# Patient Record
Sex: Female | Born: 1975 | Race: Black or African American | Hispanic: No | Marital: Single | Smoking: Never smoker
Health system: Southern US, Community
[De-identification: ages and names within clinical notes are randomized; demographics above are authoritative.]

## PROBLEM LIST (undated history)

## (undated) HISTORY — PX: TUBAL LIGATION: SHX77

---

## 2015-07-12 ENCOUNTER — Emergency Department (HOSPITAL_COMMUNITY)
Admission: EM | Admit: 2015-07-12 | Discharge: 2015-07-12 | Disposition: A | Discharge status: Home / Self Care | Payer: Self-pay | Attending: Emergency Medicine | Admitting: Emergency Medicine

## 2015-07-12 ENCOUNTER — Encounter (HOSPITAL_COMMUNITY): Payer: Self-pay

## 2015-07-12 ENCOUNTER — Emergency Department (HOSPITAL_COMMUNITY): Payer: Self-pay

## 2015-07-12 DIAGNOSIS — G8929 Other chronic pain: Secondary | ICD-10-CM | POA: Insufficient documentation

## 2015-07-12 DIAGNOSIS — R1031 Right lower quadrant pain: Secondary | ICD-10-CM | POA: Insufficient documentation

## 2015-07-12 LAB — COMPREHENSIVE METABOLIC PANEL
ALT: 16 U/L (ref 14–54)
AST: 16 U/L (ref 15–41)
Albumin: 3.7 g/dL (ref 3.5–5.0)
Alkaline Phosphatase: 61 U/L (ref 38–126)
Anion gap: 8 (ref 5–15)
BUN: 11 mg/dL (ref 6–20)
CO2: 24 mmol/L (ref 22–32)
Calcium: 9.1 mg/dL (ref 8.9–10.3)
Chloride: 106 mmol/L (ref 101–111)
Creatinine, Ser: 0.69 mg/dL (ref 0.44–1.00)
GFR calc Af Amer: >60 mL/min (ref >60)
GFR calc non Af Amer: >60 mL/min (ref >60)
Glucose, Bld: 99 mg/dL (ref 65–99)
Potassium: 3.7 mmol/L (ref 3.5–5.1)
Sodium: 138 mmol/L (ref 135–145)
Total Bilirubin: 0.3 mg/dL (ref 0.3–1.2)
Total Protein: 7.7 g/dL (ref 6.5–8.1)

## 2015-07-12 LAB — URINALYSIS, ROUTINE W REFLEX MICROSCOPIC
Bilirubin Urine: NEGATIVE
Glucose, UA: NEGATIVE mg/dL
Ketones, ur: NEGATIVE mg/dL
Leukocytes, UA: NEGATIVE
Nitrite: NEGATIVE
Protein, ur: 30 mg/dL — AB
Specific Gravity, Urine: 1.022 (ref 1.005–1.030)
pH: 5.5 (ref 5.0–8.0)

## 2015-07-12 LAB — CBC
HCT: 38.5 % (ref 36.0–46.0)
Hemoglobin: 12 g/dL (ref 12.0–15.0)
MCH: 26.8 pg (ref 26.0–34.0)
MCHC: 31.2 g/dL (ref 30.0–36.0)
MCV: 86.1 fL (ref 78.0–100.0)
Platelets: 356 10*3/uL (ref 150–400)
RBC: 4.47 MIL/uL (ref 3.87–5.11)
RDW: 13.9 % (ref 11.5–15.5)
WBC: 8.7 10*3/uL (ref 4.0–10.5)

## 2015-07-12 LAB — HCG, QUANTITATIVE, PREGNANCY: hCG, Beta Chain, Quant, S: <1 m[IU]/mL (ref <5)

## 2015-07-12 LAB — URINE MICROSCOPIC-ADD ON

## 2015-07-12 LAB — LIPASE, BLOOD: Lipase: 13 U/L (ref 11–51)

## 2015-07-12 MED ORDER — IOPAMIDOL (ISOVUE-300) INJECTION 61%
INTRAVENOUS | Status: AC
  Administered 2015-07-12: 100 mL
  Filled 2015-07-12: qty 100

## 2015-07-12 NOTE — Discharge Instructions (Signed)
The cause of your pain was not identified today. You had a CT scan performed that demonstrated kidney stones that were not causing any symptoms or blockages. You also have a cyst in left ovary. This is a simple-appearing cyst that will need to be followed up by OB/GYN. You can take Tylenol or pain returns.  Get rechecked immediately if you have any new or worrisome symptoms.  You will need to get a family doctor and a gastroenterologist to further evaluate your abdominal pain.   Abdominal Pain, Adult Many things can cause abdominal pain. Usually, abdominal pain is not caused by a disease and will improve without treatment. It can often be observed and treated at home. Your health care provider will do a physical exam and possibly order blood tests and X-rays to help determine the seriousness of your pain. However, in many cases, more time must pass before a clear cause of the pain can be found. Before that point, your health care provider may not know if you need more testing or further treatment. HOME CARE INSTRUCTIONS Monitor your abdominal pain for any changes. The following actions may help to alleviate any discomfort you are experiencing:  Only take over-the-counter or prescription medicines as directed by your health care provider.  Do not take laxatives unless directed to do so by your health care provider.  Try a clear liquid diet (broth, tea, or water) as directed by your health care provider. Slowly move to a bland diet as tolerated. SEEK MEDICAL CARE IF:  You have unexplained abdominal pain.  You have abdominal pain associated with nausea or diarrhea.  You have pain when you urinate or have a bowel movement.  You experience abdominal pain that wakes you in the night.  You have abdominal pain that is worsened or improved by eating food.  You have abdominal pain that is worsened with eating fatty foods.  You have a fever. SEEK IMMEDIATE MEDICAL CARE IF:  Your pain does not go  away within 2 hours.  You keep throwing up (vomiting).  Your pain is felt only in portions of the abdomen, such as the right side or the left lower portion of the abdomen.  You pass bloody or black tarry stools. MAKE SURE YOU:  Understand these instructions.  Will watch your condition.  Will get help right away if you are not doing well or get worse.   This information is not intended to replace advice given to you by your health care provider. Make sure you discuss any questions you have with your health care provider.   Document Released: 10/26/2004 Document Revised: 10/07/2014 Document Reviewed: 09/25/2012 Elsevier Interactive Patient Education [employer]2016 Elsevier Inc.

## 2015-07-12 NOTE — ED Provider Notes (Signed)
CSN: [id number]     Arrival date & time 07/12/15  1028 History  By signing my name below, I, Tanda RockersMargaux Venter, attest that this documentation has been prepared under the direction and in the presence of Tilden FossaElizabeth Zayne Draheim, MD. Electronically Signed: Tanda RockersMargaux Venter, ED Scribe. 07/12/2015. 4:03 PM.   Chief Complaint  Patient presents with  . Abdominal Pain   The history is provided by the patient. No language interpreter was used.    HPI Comments: Erica Padilla is a 40 y.o. female who presents to the Emergency Department complaining of gradual onset, constant, burning, RLQ abdominal pain radiating to back x 2 months. The pain is exacerbated with prlonged sitting. Pt has never been evaluated for these symptoms in the past. No new recent sexual partners. Pt decided to come in today due to being overtly worried since her mother passed away from colon cancer. She does not have a PCP and does not have insurance to be able to afford to see someone for these symptoms. Denies fever, nausea, vomiting, diarrhea, dysuria, vaginal discharge, or any other associated symptoms.    History reviewed. No pertinent past medical history. History reviewed. No pertinent past surgical history. No family history on file. Social History  Substance Use Topics  . Smoking status: Never Smoker   . Smokeless tobacco: None  . Alcohol Use: No   OB History    No data available     Review of Systems  Constitutional: Negative for fever.  Gastrointestinal: Positive for abdominal pain. Negative for nausea, vomiting and diarrhea.  Genitourinary: Negative for dysuria and vaginal discharge.  Musculoskeletal: Positive for back pain.  All other systems reviewed and are negative.   Allergies  Review of patient's allergies indicates no known allergies.  Home Medications   Prior to Admission medications   Not on File   BP 133/68 mmHg  Pulse 84  Temp(Src) 98.1 F (36.7 C) (Oral)  Resp 16  Ht 5\' 5"  (1.651 m)  Wt 294 lb  (133.358 kg)  BMI 48.92 kg/m2  SpO2 100%  LMP 06/28/2015 Physical Exam  Constitutional: She is oriented to person, place, and time. She appears well-developed and well-nourished.  HENT:  Head: Normocephalic and atraumatic.  Cardiovascular: Normal rate and regular rhythm.   No murmur heard. Pulmonary/Chest: Effort normal and breath sounds normal. No respiratory distress.  Abdominal: Soft. There is tenderness. There is no rebound and no guarding.  Mild right lower quadrant tenderness and suprapubic tenderness  Musculoskeletal: She exhibits no edema or tenderness.  Neurological: She is alert and oriented to person, place, and time.  Skin: Skin is warm and dry.  Psychiatric: Her behavior is normal.  Pt is tearful on exam  Nursing note and vitals reviewed.   ED Course  Procedures (including critical care time)  DIAGNOSTIC STUDIES: Oxygen Saturation is 95% on RA, adequate by my interpretation.    COORDINATION OF CARE: 3:59 PM-Discussed treatment plan which includes CT A/P with pt at bedside and pt agreed to plan.   Labs Review Labs Reviewed  URINALYSIS, ROUTINE W REFLEX MICROSCOPIC (NOT AT [facility]) - Abnormal; Notable for the following:    Hgb urine dipstick TRACE (*)    Protein, ur 30 (*)    All other components within normal limits  URINE MICROSCOPIC-ADD ON - Abnormal; Notable for the following:    Squamous Epithelial / LPF 0-5 (*)    Bacteria, UA FEW (*)    All other components within normal limits  LIPASE, BLOOD  COMPREHENSIVE METABOLIC PANEL  CBC  HCG, QUANTITATIVE, PREGNANCY    Imaging Review Ct Abdomen Pelvis W Contrast  07/12/2015  CLINICAL DATA:  Right-sided abdominal pain for 3 days EXAM: CT ABDOMEN AND PELVIS WITH CONTRAST TECHNIQUE: Multidetector CT imaging of the abdomen and pelvis was performed using the standard protocol following bolus administration of intravenous contrast. CONTRAST:  85 mL ISOVUE-300 IOPAMIDOL (ISOVUE-300) INJECTION 61% COMPARISON:  None.  FINDINGS: Lung bases are free of acute infiltrate or sizable effusion. The liver, gallbladder, spleen, adrenal glands and pancreas are within normal limits. Kidneys are within normal limits bilaterally. A few tiny nonobstructing renal stones are noted bilaterally. Normal excretion of contrast material is seen. Small fat containing umbilical hernia is seen. Scattered diverticular change is noted. The appendix is within normal limits. No bowel obstructive change is seen. A left ovarian cyst is noted measuring 3.4 cm. The uterus is otherwise within normal limits. The bladder is well distended. No pelvic mass lesion is seen. No acute bony abnormality is noted. IMPRESSION: Tiny nonobstructing renal stones. Left ovarian cyst without complicating factors. Small fat containing umbilical hernia. Diverticulosis without diverticulitis. Electronically Signed   By: Alcide Clever M.D.   On: 07/12/2015 17:35   I have personally reviewed and evaluated these images and lab results as part of my medical decision-making.   EKG Interpretation None      MDM   Final diagnoses:  Chronic RLQ pain   Patient here for evaluation of 2 months of right lower quadrant pain. She does have some tenderness on examination. She is concern for possible colon cancer since her mother has had. CT scan obtained with no significant abnormalities. Discussed the patient findings of nonobstructive kidney stones and ovarian cyst. Discussed outpatient follow-up, home care, return precautions.  I personally performed the services described in this documentation, which was scribed in my presence. The recorded information has been reviewed and is accurate.      Tilden Fossa, MD 07/13/15 [phone]

## 2015-07-12 NOTE — ED Notes (Addendum)
Patient complaining of right lower quadrant  burning pain that begins in the front and radiates to the back  that has been going on for a month. LMP was 2 weeks ago an dno vaginal discharge. Came in today because she increasingly worried.

## 2016-02-05 ENCOUNTER — Encounter (HOSPITAL_COMMUNITY): Payer: Self-pay | Admitting: Emergency Medicine

## 2016-02-05 ENCOUNTER — Emergency Department (HOSPITAL_COMMUNITY)
Admission: EM | Admit: 2016-02-05 | Discharge: 2016-02-05 | Disposition: A | Discharge status: Home / Self Care | Payer: Self-pay | Attending: Emergency Medicine | Admitting: Emergency Medicine

## 2016-02-05 ENCOUNTER — Emergency Department (HOSPITAL_COMMUNITY): Payer: Self-pay

## 2016-02-05 DIAGNOSIS — M545 Low back pain, unspecified: Secondary | ICD-10-CM

## 2016-02-05 LAB — URINALYSIS, MICROSCOPIC (REFLEX)

## 2016-02-05 LAB — URINALYSIS, ROUTINE W REFLEX MICROSCOPIC
Bilirubin Urine: NEGATIVE
GLUCOSE, UA: NEGATIVE mg/dL
Ketones, ur: NEGATIVE mg/dL
Nitrite: NEGATIVE
PH: 6.5 (ref 5.0–8.0)
PROTEIN: 30 mg/dL — AB
SPECIFIC GRAVITY, URINE: 1.02 (ref 1.005–1.030)

## 2016-02-05 LAB — POC URINE PREG, ED: Preg Test, Ur: NEGATIVE

## 2016-02-05 MED ORDER — METHOCARBAMOL 500 MG PO TABS: 500.0000 mg | ORAL_TABLET | Freq: Every evening | ORAL | 0 refills | Status: DC | PRN

## 2016-02-05 MED ORDER — METHOCARBAMOL 500 MG PO TABS
1000.0000 mg | ORAL_TABLET | Freq: Once | ORAL | Status: AC
  Administered 2016-02-05: 1000 mg via ORAL
  Filled 2016-02-05: qty 2

## 2016-02-05 MED ORDER — TRAMADOL HCL 50 MG PO TABS: 50.0000 mg | ORAL_TABLET | Freq: Four times a day (QID) | ORAL | 0 refills | Status: DC | PRN

## 2016-02-05 MED ORDER — MELOXICAM 15 MG PO TABS: 15.0000 mg | ORAL_TABLET | Freq: Every day | ORAL | 0 refills | Status: DC

## 2016-02-05 MED ORDER — TRAMADOL HCL 50 MG PO TABS
100.0000 mg | ORAL_TABLET | Freq: Once | ORAL | Status: AC
  Administered 2016-02-05: 100 mg via ORAL
  Filled 2016-02-05: qty 2

## 2016-02-05 MED ORDER — MELOXICAM 15 MG PO TABS
15.0000 mg | ORAL_TABLET | Freq: Every day | ORAL | Status: DC
  Administered 2016-02-05: 15 mg via ORAL
  Filled 2016-02-05: qty 1

## 2016-02-05 NOTE — ED Provider Notes (Signed)
[facility]-EMERGENCY DEPT Provider Note   CSN: [id number] Arrival date & time: 02/05/16  1529  By signing my name below, I, Vista Minkobert Ross, attest that this documentation has been prepared under the direction and in the presence of YahooKelly Seabron Iannello PA-C.  Electronically Signed: Vista Minkobert Ross, ED Scribe. 02/05/16. 4:59 PM.   History   Chief Complaint Chief Complaint  Patient presents with  . Back Pain   HPI HPI Comments: Erica Padilla is a 41 y.o. female who presents to the Emergency Department complaining of persistent lower back pain and stiffness that started approximately one month ago. Pt states that her pain is worse in the morning when she wakes up and states that it is extremely stiff. The stiffness and pain is relieved progressively throughout the morning as she moves around more. She presents today because she had increased difficulty getting out of bed this morning. She does report an intermittent pain that radiates circumferentially on both sides of her hips. Pt has taken Aleve and Flexeril with no significant relief of symptoms. No numbness, bowel or bladder incontinence. No radiation of pain in lower extremities. No urinary symptoms. She denies any known injury. Pt has no current PCP and is uninsured. 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  The history is provided by the patient. No language interpreter was used.    History reviewed. No pertinent past medical history.  There are no active problems to display for this patient.   History reviewed. No pertinent surgical history.  OB History    No data available      Home Medications    Prior to Admission medications   Not on File    Family History No family history on file.  Social History Social History  Substance Use Topics  . Smoking status: Never Smoker  . Smokeless tobacco: Not on file  . Alcohol use No    Allergies   Patient has no known allergies.   Review of Systems Review of Systems    Endocrine: Negative for polyuria.  Genitourinary: Negative for dysuria, hematuria and urgency.  Musculoskeletal: Positive for back pain (lower).  Neurological: Negative for numbness.     Physical Exam Updated Vital Signs BP 138/82 (BP Location: Left Arm)   Pulse 107   Temp 97.9 F (36.6 C) (Oral)   Resp 18   Ht 5\' 4"  (1.626 m)   Wt 232 lb (105.2 kg)   LMP 01/15/2016   SpO2 100%   BMI 39.82 kg/m   Physical Exam  Constitutional: She is oriented to person, place, and time. She appears well-developed and well-nourished. No distress.  HENT:  Head: Normocephalic and atraumatic.  Eyes: Conjunctivae are normal. Pupils are equal, round, and reactive to light. Right eye exhibits no discharge. Left eye exhibits no discharge. No scleral icterus.  Neck: Normal range of motion.  Cardiovascular: Normal rate.   Pulmonary/Chest: Effort normal. No respiratory distress.  Abdominal: She exhibits no distension.  Musculoskeletal:  Back: Inspection: No masses, deformity, or rash Palpation: Lumbar midline spinal tenderness with bilateral flank tenderness. Strength: 5/5 in lower extremities and normal plantar and dorsiflexion Sensation: Intact sensation with light touch in lower extremities bilaterally Gait: Stiff gait Reflexes: Patellar reflex is 2+ bilaterally SLR: Negative seated straight leg raise   Neurological: She is alert and oriented to person, place, and time.  Skin: Skin is warm and dry.  Psychiatric: She has a normal mood and affect. Her behavior is normal.  Nursing note and vitals  reviewed.    ED Treatments / Results  DIAGNOSTIC STUDIES: Oxygen Saturation is 100% on RA, normal by my interpretation.  COORDINATION OF CARE: 4:55 PM-Discussed treatment plan with pt at bedside and pt agreed to plan.   Labs (all labs ordered are listed, but only abnormal results are displayed) Labs Reviewed  URINALYSIS, ROUTINE W REFLEX MICROSCOPIC - Abnormal; Notable for the following:        Result Value   Hgb urine dipstick SMALL (*)    Protein, ur 30 (*)    Leukocytes, UA SMALL (*)    All other components within normal limits  URINALYSIS, MICROSCOPIC (REFLEX) - Abnormal; Notable for the following:    Bacteria, UA MANY (*)    Squamous Epithelial / LPF 6-30 (*)    All other components within normal limits  POC URINE PREG, ED    EKG  EKG Interpretation None       Radiology Dg Lumbar Spine Complete  Result Date: 02/05/2016 CLINICAL DATA:  Low back pain, no injury EXAM: LUMBAR SPINE - COMPLETE 4+ VIEW COMPARISON:  None. FINDINGS: Normal alignment. No fracture or mass. S1 is partially lumbarized. Lowest vertebra L5 superior endplate. No significant degenerative change. No pars defect. IMPRESSION: Negative. Electronically Signed   By: Marlan Palau M.D.   On: 02/05/2016 18:29    Procedures Procedures (including critical care time)  Medications Ordered in ED Medications  methocarbamol (ROBAXIN) tablet 1,000 mg (1,000 mg Oral Given 02/05/16 1806)  traMADol (ULTRAM) tablet 100 mg (100 mg Oral Given 02/05/16 1806)     Initial Impression / Assessment and Plan / ED Course  I have reviewed the triage vital signs and the nursing notes.  Pertinent labs & imaging results that were available during my care of the patient were reviewed by me and considered in my medical decision making (see chart for details).  Clinical Course    41 year old female with MSK back pain for a month now. Unclear etiology. Possibly started as lumbar strain and has persisted due to her profession. Back exam is benign. Xray negative. UA shows small hgb, 30 protein, small leukocytes, many bacteria, and 6-30 WBC however appears contaminated and pt has no urinary complaints. Will hold off on treatment at this time. Pain treated in ED and pt reports some improvement. Will refer to Ortho and rx NSAIDs, muscle relaxer, and tramadol prn. Patient is NAD, non-toxic, with stable VS. Patient is informed of clinical  course, understands medical decision making process, and agrees with plan. Opportunity for questions provided and all questions answered. Return precautions given.   Final Clinical Impressions(s) / ED Diagnoses   Final diagnoses:  Bilateral low back pain without sciatica, unspecified chronicity    New Prescriptions Discharge Medication List as of 02/05/2016  7:25 PM    START taking these medications   Details  meloxicam (MOBIC) 15 MG tablet Take 1 tablet (15 mg total) by mouth daily., Starting Sat 02/05/2016, Print    methocarbamol (ROBAXIN) 500 MG tablet Take 1 tablet (500 mg total) by mouth at bedtime and may repeat dose one time if needed., Starting Sat 02/05/2016, Print    traMADol (ULTRAM) 50 MG tablet Take 1 tablet (50 mg total) by mouth every 6 (six) hours as needed., Starting Sat 02/05/2016, Print       I personally performed the services described in this documentation, which was scribed in my presence. The recorded information has been reviewed and is accurate.     Bethel Born, PA-C  02/06/16 1112    Vanetta Mulders, MD 02/08/16 2122

## 2016-02-05 NOTE — Discharge Instructions (Signed)
Take anti-inflammatory medicine (Mobic) for the next week. Take this medicine with food. Take muscle relaxer at bedtime to help you sleep. This medicine makes you drowsy so do not take before driving or work Take pain medicine when pain is severe Use a heating pad for sore muscles - use for 20 minutes several times a day Follow up with Orthopedics

## 2016-02-05 NOTE — ED Triage Notes (Signed)
Pt complaint of lower back pain/spasms for a month. Pt denies injury or GU symptoms.

## 2016-09-19 ENCOUNTER — Emergency Department (HOSPITAL_COMMUNITY)
Admission: EM | Admit: 2016-09-19 | Discharge: 2016-09-19 | Disposition: A | Discharge status: Home / Self Care | Payer: Self-pay | Attending: Emergency Medicine | Admitting: Emergency Medicine

## 2016-09-19 ENCOUNTER — Encounter (HOSPITAL_COMMUNITY): Payer: Self-pay | Admitting: Emergency Medicine

## 2016-09-19 ENCOUNTER — Emergency Department (HOSPITAL_COMMUNITY): Payer: Self-pay

## 2016-09-19 DIAGNOSIS — Y92000 Kitchen of unspecified non-institutional (private) residence as  the place of occurrence of the external cause: Secondary | ICD-10-CM | POA: Insufficient documentation

## 2016-09-19 DIAGNOSIS — Y939 Activity, unspecified: Secondary | ICD-10-CM | POA: Insufficient documentation

## 2016-09-19 DIAGNOSIS — Y998 Other external cause status: Secondary | ICD-10-CM | POA: Insufficient documentation

## 2016-09-19 DIAGNOSIS — W208XXA Other cause of strike by thrown, projected or falling object, initial encounter: Secondary | ICD-10-CM | POA: Insufficient documentation

## 2016-09-19 DIAGNOSIS — S92424A Nondisplaced fracture of distal phalanx of right great toe, initial encounter for closed fracture: Secondary | ICD-10-CM | POA: Insufficient documentation

## 2016-09-19 MED ORDER — IBUPROFEN 600 MG PO TABS: 600.0000 mg | ORAL_TABLET | Freq: Four times a day (QID) | ORAL | 0 refills | Status: DC | PRN

## 2016-09-19 MED ORDER — HYDROCODONE-ACETAMINOPHEN 5-325 MG PO TABS: 1.0000 | ORAL_TABLET | ORAL | 0 refills | Status: DC | PRN

## 2016-09-19 NOTE — ED Notes (Signed)
ED Provider at bedside. 

## 2016-09-19 NOTE — ED Triage Notes (Signed)
Pt reports glass from oven door falling out and landing on R foot on Sunday. Pt c/o pain and swelling to R big toe.  Pt ambulatory.

## 2016-09-19 NOTE — ED Provider Notes (Signed)
[facility]-EMERGENCY DEPT Provider Note   CSN: [id number] Arrival date & time: 09/19/16  4098     History   Chief Complaint Chief Complaint  Patient presents with  . Toe Injury    HPI Erica Padilla is a 41 y.o. female.  Pt presents to the ED today with right big toe pain.  Pt said she shut the oven and the glass fell out on her right big toe 2 days ago.  The pt said that it is painful and swollen.  She can walk, but it hurts.      History reviewed. No pertinent past medical history.  There are no active problems to display for this patient.   Past Surgical History:  Procedure Laterality Date  . TUBAL LIGATION      OB History    No data available       Home Medications    Prior to Admission medications   Medication Sig Start Date End Date Taking? Authorizing Provider  HYDROcodone-acetaminophen (NORCO/VICODIN) 5-325 MG tablet Take 1 tablet by mouth every 4 (four) hours as needed. 09/19/16   Jacalyn Lefevre, MD  ibuprofen (ADVIL,MOTRIN) 600 MG tablet Take 1 tablet (600 mg total) by mouth every 6 (six) hours as needed. 09/19/16   Jacalyn Lefevre, MD  meloxicam (MOBIC) 15 MG tablet Take 1 tablet (15 mg total) by mouth daily. 02/05/16   Bethel Born, PA-C  methocarbamol (ROBAXIN) 500 MG tablet Take 1 tablet (500 mg total) by mouth at bedtime and may repeat dose one time if needed. 02/05/16   Bethel Born, PA-C  traMADol (ULTRAM) 50 MG tablet Take 1 tablet (50 mg total) by mouth every 6 (six) hours as needed. 02/05/16   Bethel Born, PA-C    Family History No family history on file.  Social History Social History  Substance Use Topics  . Smoking status: Never Smoker  . Smokeless tobacco: Never Used  . Alcohol use No     Allergies   Patient has no known allergies.   Review of Systems Review of Systems  Musculoskeletal:       Right great toe pain  All other systems reviewed and are negative.    Physical Exam Updated Vital Signs BP (!) 141/89 (BP  Location: Right Arm)   Pulse 79   Temp 98.2 F (36.8 C) (Oral)   Resp 18   Ht 5\' 4"  (1.626 m)   Wt 99.3 kg (219 lb)   LMP 08/26/2016   SpO2 100%   BMI 37.59 kg/m   Physical Exam  Constitutional: She appears well-developed and well-nourished.  HENT:  Head: Normocephalic and atraumatic.  Right Ear: External ear normal.  Left Ear: External ear normal.  Nose: Nose normal.  Mouth/Throat: Oropharynx is clear and moist.  Eyes: Pupils are equal, round, and reactive to light. Conjunctivae and EOM are normal.  Neck: Normal range of motion. Neck supple.  Cardiovascular: Normal rate, regular rhythm, normal heart sounds and intact distal pulses.   Pulmonary/Chest: Effort normal and breath sounds normal.  Abdominal: Soft. Bowel sounds are normal.  Musculoskeletal:       Feet:  Nursing note and vitals reviewed.    ED Treatments / Results  Labs (all labs ordered are listed, but only abnormal results are displayed) Labs Reviewed - No data to display  EKG  EKG Interpretation None       Radiology Dg Toe Great Right  Result Date: 09/19/2016 CLINICAL DATA:  Injury.  Pain. EXAM: RIGHT GREAT TOE COMPARISON:  No recent prior . FINDINGS: No radiopaque foreign body. Tiny fracture noted along the medial base of the proximal phalanx of the right great toe. Slight displacement. Diffuse degenerative change. IMPRESSION: Tiny slightly displaced fracture fragment noted along the medial base of the proximal phalanx of the right great toe. Electronically Signed   By: Maisie Fus  Register   On: 09/19/2016 07:40    Procedures Procedures (including critical care time)  Medications Ordered in ED Medications - No data to display   Initial Impression / Assessment and Plan / ED Course  I have reviewed the triage vital signs and the nursing notes.  Pertinent labs & imaging results that were available during my care of the patient were reviewed by me and considered in my medical decision making (see  chart for details).    Pt will be placed in post op shoe.  She is given the number of ortho to f/u.  Return if worse.  Final Clinical Impressions(s) / ED Diagnoses   Final diagnoses:  Closed nondisplaced fracture of distal phalanx of right great toe, initial encounter    New Prescriptions New Prescriptions   HYDROCODONE-ACETAMINOPHEN (NORCO/VICODIN) 5-325 MG TABLET    Take 1 tablet by mouth every 4 (four) hours as needed.   IBUPROFEN (ADVIL,MOTRIN) 600 MG TABLET    Take 1 tablet (600 mg total) by mouth every 6 (six) hours as needed.     Jacalyn Lefevre, MD 09/19/16 [id number]

## 2016-12-17 ENCOUNTER — Encounter (HOSPITAL_COMMUNITY): Payer: Self-pay

## 2016-12-17 ENCOUNTER — Emergency Department (HOSPITAL_COMMUNITY)
Admission: EM | Admit: 2016-12-17 | Discharge: 2016-12-17 | Disposition: A | Discharge status: Home / Self Care | Payer: Self-pay | Attending: Emergency Medicine | Admitting: Emergency Medicine

## 2016-12-17 DIAGNOSIS — Y939 Activity, unspecified: Secondary | ICD-10-CM | POA: Insufficient documentation

## 2016-12-17 DIAGNOSIS — S39012A Strain of muscle, fascia and tendon of lower back, initial encounter: Secondary | ICD-10-CM | POA: Insufficient documentation

## 2016-12-17 DIAGNOSIS — Y929 Unspecified place or not applicable: Secondary | ICD-10-CM | POA: Insufficient documentation

## 2016-12-17 DIAGNOSIS — Y33XXXA Other specified events, undetermined intent, initial encounter: Secondary | ICD-10-CM | POA: Insufficient documentation

## 2016-12-17 DIAGNOSIS — Y998 Other external cause status: Secondary | ICD-10-CM | POA: Insufficient documentation

## 2016-12-17 MED ORDER — CYCLOBENZAPRINE HCL 10 MG PO TABS: 10.0000 mg | ORAL_TABLET | Freq: Two times a day (BID) | ORAL | 0 refills | Status: AC | PRN

## 2016-12-17 MED ORDER — NAPROXEN 500 MG PO TABS: 500.0000 mg | ORAL_TABLET | Freq: Two times a day (BID) | ORAL | 0 refills | Status: AC

## 2016-12-17 NOTE — ED Triage Notes (Signed)
Per Pt, Pt is coming from home with complaints of lower back pain that she has been having intermittently x 1 year. Pt has been seen in the past and stated that it was a pulled muscle. Denies urinary symptoms at this time.

## 2016-12-17 NOTE — Discharge Instructions (Signed)
Call [facility] to schedule follow [facility].

## 2016-12-17 NOTE — ED Provider Notes (Signed)
[facility]ENC[facility] Provider Note   CSN: [id number] Arrival date & time: 12/17/16  1408     History   Chief Complaint Chief Complaint  Patient presents with  . Back Pain    HPI Erica Padilla is a 41 y.o. female who presents to the ED with back pain. Patient that she has had back pain off and on for the past year. Patient has been evaluated in the past for same and at that time had a pulled muscle. 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   HPI  History reviewed. No pertinent past medical history.  There are no active problems to display for this patient.   Past Surgical History:  Procedure Laterality Date  . TUBAL LIGATION      OB History    No data available       Home Medications    Prior to Admission medications   Medication Sig Start Date End Date Taking? Authorizing Provider  cyclobenzaprine (FLEXERIL) 10 MG tablet Take 1 tablet (10 mg total) 2 (two) times daily as needed by mouth for muscle spasms. 12/17/16   Janne NapoleonNeese, Trenna Kiely M, NP  naproxen (NAPROSYN) 500 MG tablet Take 1 tablet (500 mg total) 2 (two) times daily by mouth. 12/17/16   Janne NapoleonNeese, Dura Mccormack M, NP    Family History No family history on file.  Social History Social History   Tobacco Use  . Smoking status: Never Smoker  . Smokeless tobacco: Never Used  Substance Use Topics  . Alcohol use: No  . Drug use: No     Allergies   Patient has no known allergies.   Review of Systems Review of Systems  Constitutional: Negative for fever.  HENT: Negative.   Eyes: Negative for pain, discharge and visual disturbance.  Respiratory: Negative for cough and shortness of breath.   Cardiovascular: Negative for chest pain.  Gastrointestinal: Negative for abdominal pain, nausea and vomiting.  Genitourinary: Negative for dysuria, frequency and urgency.    Musculoskeletal: Positive for back pain. Negative for neck pain.  Skin: Negative for rash and wound.  Neurological: Negative for dizziness, syncope and headaches.  Psychiatric/Behavioral: Negative for confusion. The patient is not nervous/anxious.      Physical Exam Updated Vital Signs BP (!) 143/86 (BP Location: Right Arm)   Pulse 91   Temp 98.3 F (36.8 C) (Oral)   Resp 18   Ht 5\' 4"  (1.626 m)   Wt 102.5 kg (226 lb)   LMP 11/10/2016   SpO2 100%   BMI 38.79 kg/m   Physical Exam  Constitutional: She appears well-developed and well-nourished. No distress.  HENT:  Head: Normocephalic and atraumatic.  Nose: Nose normal.  Mouth/Throat: Uvula is midline, oropharynx is clear and moist and mucous membranes are normal.  Eyes: EOM are normal.  Neck: Normal range of motion. Neck supple.  Cardiovascular: Normal rate and regular rhythm.  Pulmonary/Chest: Effort normal. She has no wheezes. She has no rales.  Abdominal: Soft. Bowel sounds are normal. There is no tenderness.  Musculoskeletal:       Lumbar back: She exhibits tenderness, pain and spasm. She exhibits normal pulse.       Back:  Neurological: She is alert. She has normal strength. No sensory deficit. Gait normal.  Reflex Scores:      Bicep reflexes are 2+ on the right  side and 2+ on the left side.      Brachioradialis reflexes are 2+ on the right side and 2+ on the left side.      Patellar reflexes are 2+ on the right side and 2+ on the left side. Straight leg raises without difficulty  Skin: Skin is warm and dry.  Psychiatric: She has a normal mood and affect. Her behavior is normal.  Nursing note and vitals reviewed.    ED Treatments / Results  Labs (all labs ordered are listed, but only abnormal results are displayed) Labs Reviewed - No data to display   Radiology No results found.  Procedures Procedures (including critical care time)  Medications Ordered in ED Medications - No data to  display   Initial Impression / Assessment and Plan / ED Course  I have reviewed the triage vital signs and the nursing notes.  Patient with back pain.  No neurological deficits and normal neuro exam.  Patient can walk but states is painful.  No loss of bowel or bladder control.  No concern for cauda equina.  No fever, night sweats, weight loss, h/o cancer, IVDU.  RICE protocol and pain medicine indicated and discussed with patient. Discussed that medication can cause her to be sleepy.   Final Clinical Impressions(s) / ED Diagnoses   Final diagnoses:  Strain of lumbar region, initial encounter    ED Discharge Orders        Ordered    naproxen (NAPROSYN) 500 MG tablet  2 times daily     12/17/16 1730    cyclobenzaprine (FLEXERIL) 10 MG tablet  2 times daily PRN     12/17/16 1730       Kerrie Buffaloeese, Tayten Bergdoll McCallsburgM, NP 12/17/16 1737    Tilden Fossaees, Elizabeth, MD 12/19/16 [phone]

## 2017-04-12 ENCOUNTER — Other Ambulatory Visit (HOSPITAL_COMMUNITY)
Admission: RE | Admit: 2017-04-12 | Discharge: 2017-04-12 | Disposition: A | Discharge status: Home / Self Care | Payer: BLUE CROSS/BLUE SHIELD | Source: Ambulatory Visit | Attending: Physician Assistant | Admitting: Physician Assistant

## 2017-04-12 ENCOUNTER — Other Ambulatory Visit: Payer: Self-pay | Admitting: Physician Assistant

## 2017-04-12 DIAGNOSIS — Z0000 Encounter for general adult medical examination without abnormal findings: Secondary | ICD-10-CM | POA: Diagnosis not present

## 2017-04-18 LAB — CYTOLOGY - PAP
DIAGNOSIS: NEGATIVE
HPV: NOT DETECTED

## 2017-11-05 IMAGING — CR DG LUMBAR SPINE COMPLETE 4+V
5 series · 5 of 5 positions shown · non-contrast
Comparison: None.

CLINICAL DATA: Low back pain, no injury

EXAM:
LUMBAR SPINE - COMPLETE 4+ VIEW

[t lumbar spine ap]
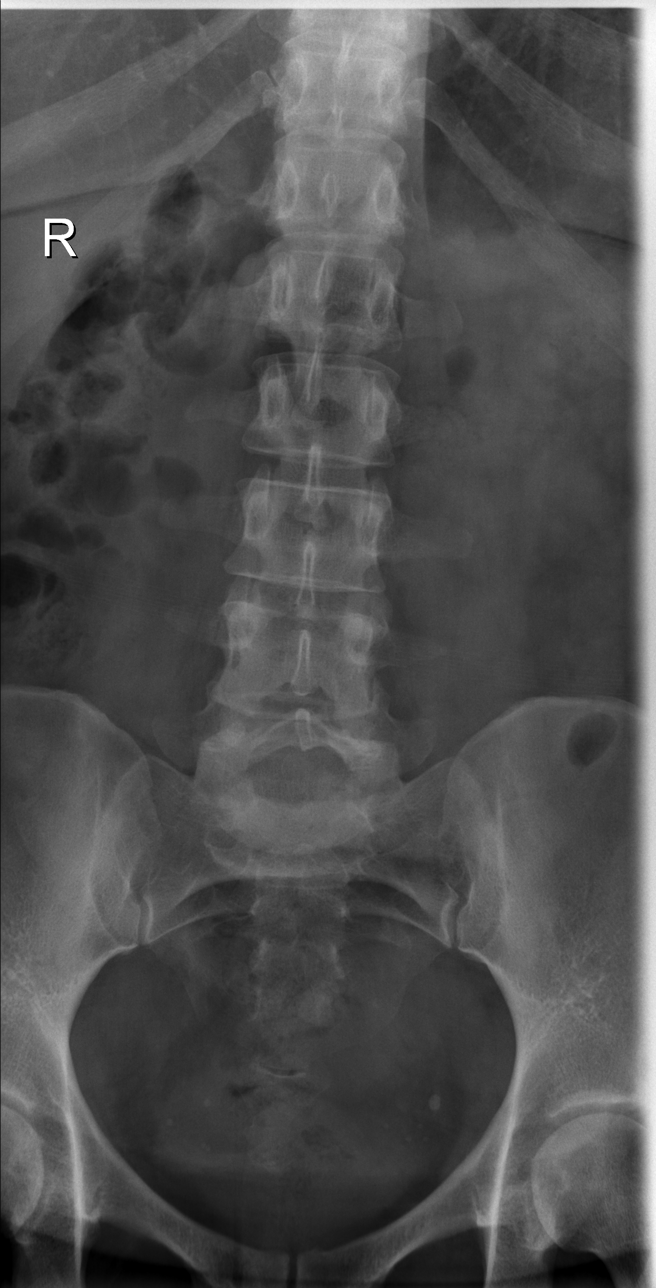

[t lumbar spine obl (1 of 2)]
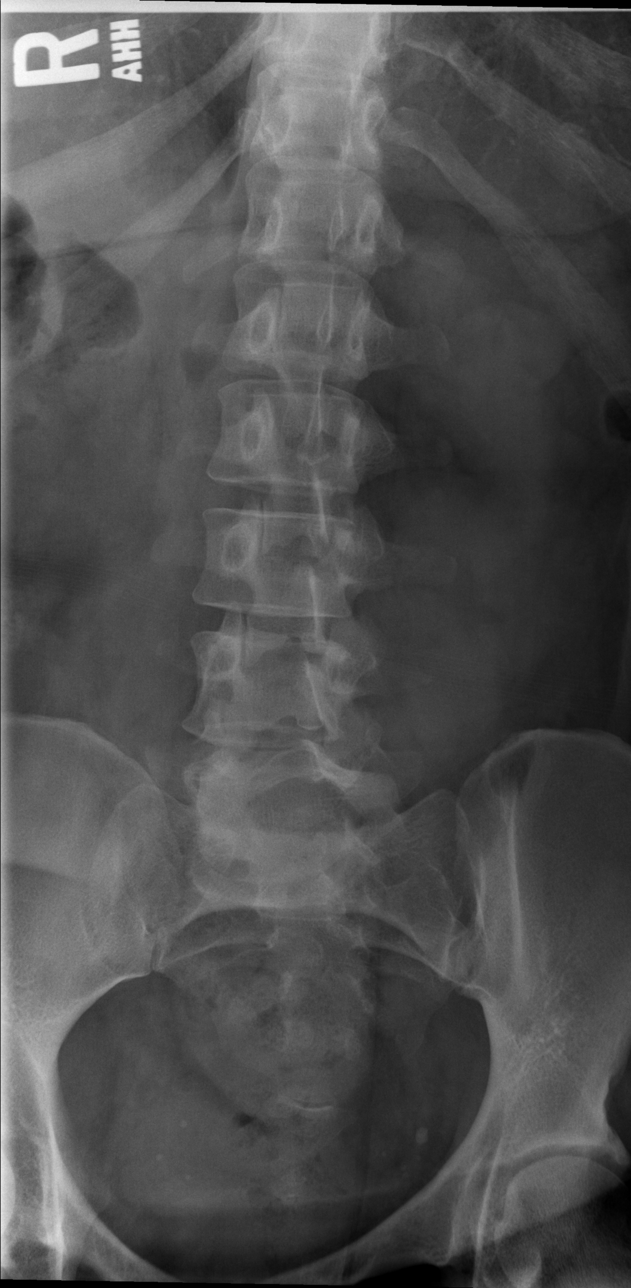

[t lumbar spine obl (2 of 2)]
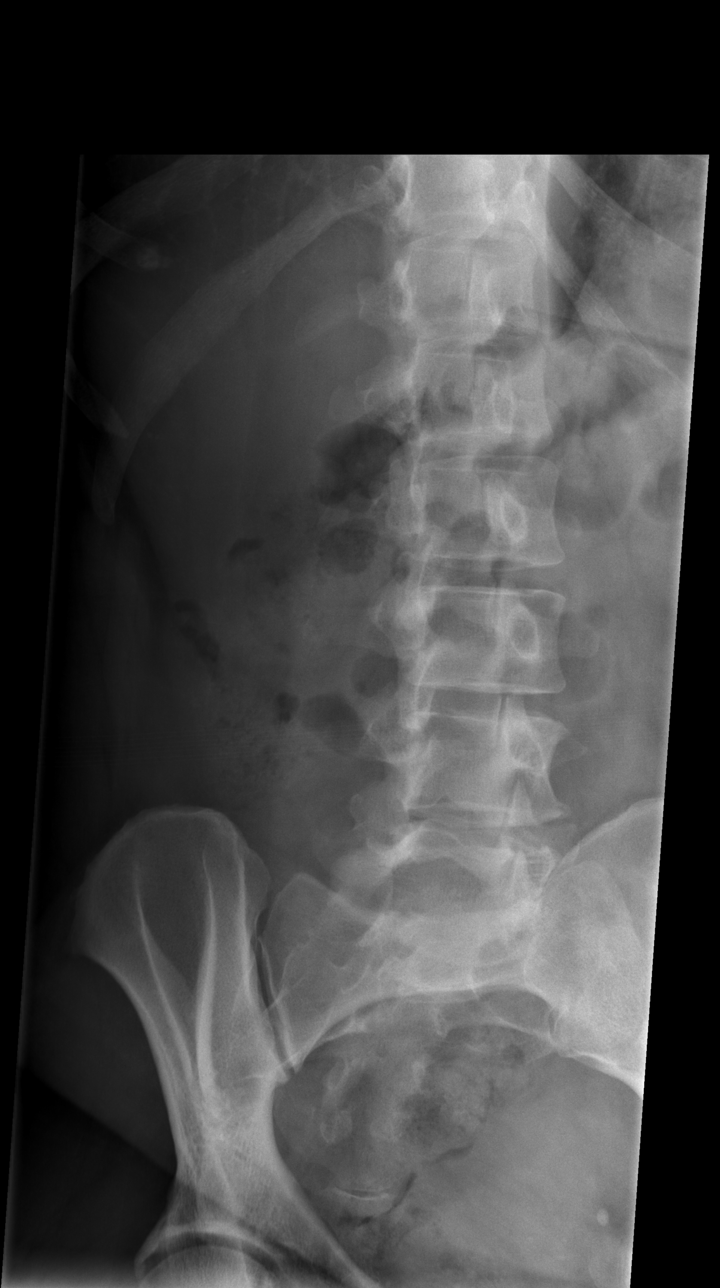

[t lumbar spine lat]
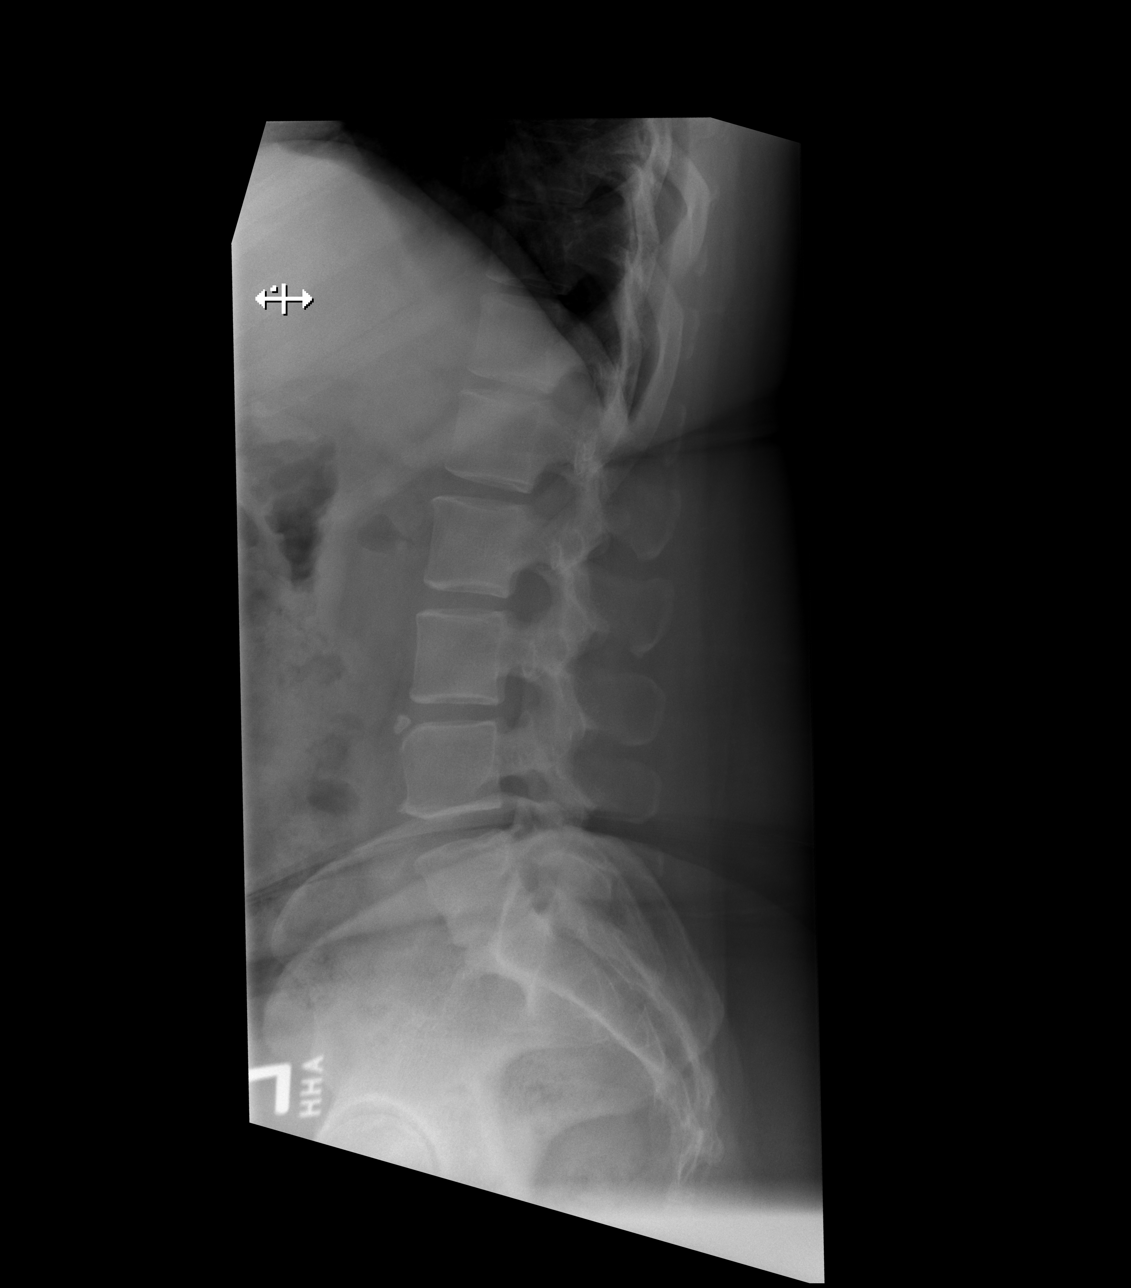

[t lumbar l-5 s-1 spot]
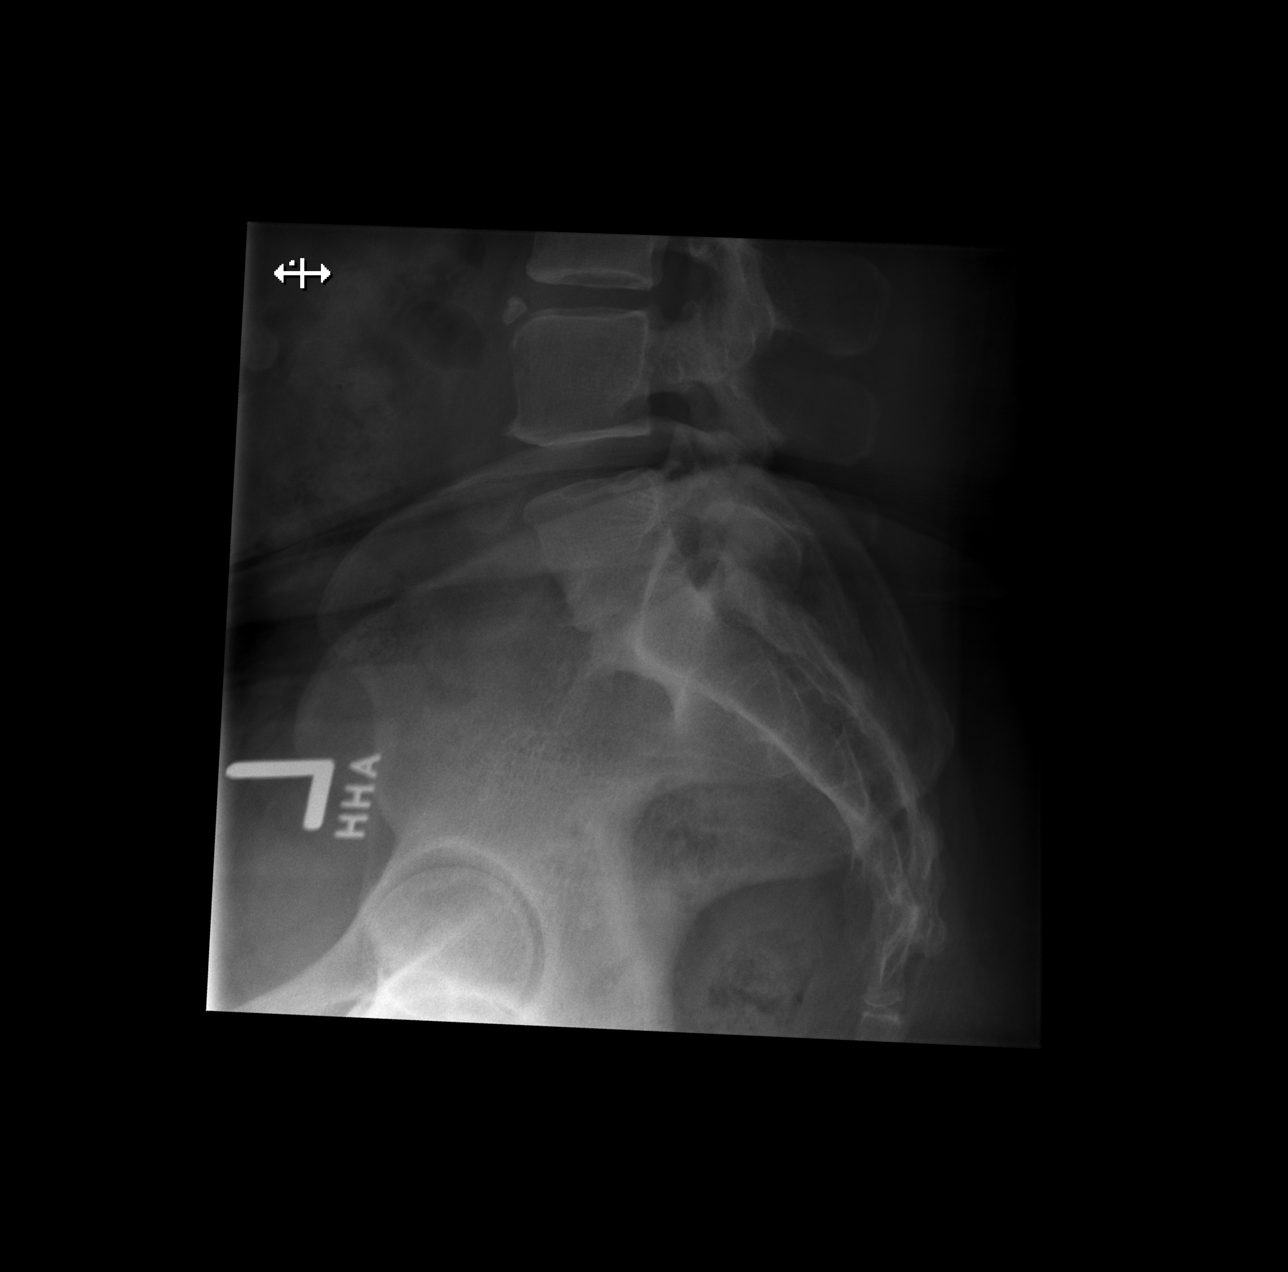

[5 of 5 positions shown; findings below may reference images not displayed]

FINDINGS: Normal alignment. No fracture or mass. S1 is partially lumbarized.
Lowest vertebra L5 superior endplate. No significant degenerative
change. No pars defect.
IMPRESSION: Negative.

## 2018-11-21 ENCOUNTER — Other Ambulatory Visit: Payer: Self-pay

## 2018-11-21 DIAGNOSIS — Z20822 Contact with and (suspected) exposure to covid-19: Secondary | ICD-10-CM

## 2018-11-23 LAB — NOVEL CORONAVIRUS, NAA: SARS-CoV-2, NAA: NOT DETECTED

## 2020-06-10 ENCOUNTER — Ambulatory Visit
Admission: EM | Admit: 2020-06-10 | Discharge: 2020-06-10 | Disposition: A | Discharge status: Home / Self Care | Payer: 59 | Attending: Family Medicine | Admitting: Family Medicine

## 2020-06-10 ENCOUNTER — Encounter: Payer: Self-pay | Admitting: Emergency Medicine

## 2020-06-10 ENCOUNTER — Other Ambulatory Visit: Payer: Self-pay

## 2020-06-10 DIAGNOSIS — Z202 Contact with and (suspected) exposure to infections with a predominantly sexual mode of transmission: Secondary | ICD-10-CM | POA: Diagnosis not present

## 2020-06-10 MED ORDER — METRONIDAZOLE 500 MG PO TABS: ORAL_TABLET | ORAL | 0 refills | Status: DC

## 2020-06-10 NOTE — Discharge Instructions (Signed)
We have sent testing for sexually transmitted infections. We will notify you of any positive results once they are received. If required, we will prescribe any medications you might need.  Please refrain from all sexual activity for at least the next seven days.  

## 2020-06-10 NOTE — ED Provider Notes (Signed)
  [facility] CARE CENTER   [id number] 06/10/20 Arrival Time: 1810  ASSESSMENT & PLAN:  1. STD exposure    Meds ordered this encounter  Medications  . metroNIDAZOLE (FLAGYL) 500 MG tablet    Sig: Take 4 tablets as a single dose.    Dispense:  4 tablet    Refill:  0   Discharge Instructions     We have sent testing for sexually transmitted infections. We will notify you of any positive results once they are received. If required, we will prescribe any medications you might need.  Please refrain from all sexual activity for at least the next seven days.  Without s/s of PID.  Labs Reviewed  CERVICOVAGINAL ANCILLARY ONLY   Will notify of any positive results. Instructed to refrain from sexual activity for at least seven days.  Reviewed expectations re: course of current medical issues. Questions answered. Outlined signs and symptoms indicating need for more acute intervention. Patient verbalized understanding. After Visit Summary given.   SUBJECTIVE:  Erica Padilla is a 45 y.o. female who reports possible trichomonas exposure. No symptoms.   OBJECTIVE:  Vitals:   06/10/20 1850  BP: (!) 157/96  Pulse: 86  Resp: 18  Temp: 98.1 F (36.7 C)  TempSrc: Oral  SpO2: 99%     General appearance: alert, cooperative, appears stated age and no distress Lungs: unlabored respirations; speaks full sentences without difficulty Abdomen: benign GU: deferred Skin: warm and dry Psychological: alert and cooperative; normal mood and affect.    Labs Reviewed  CERVICOVAGINAL ANCILLARY ONLY    No Known Allergies  History reviewed. No pertinent past medical history. History reviewed. No pertinent family history. Social History   Socioeconomic History  . Marital status: Single    Spouse name: Not on file  . Number of children: Not on file  . Years of education: Not on file  . Highest education level: Not on file  Occupational History  . Not on file  Tobacco Use  . Smoking  status: Never Smoker  . Smokeless tobacco: Never Used  Vaping Use  . Vaping Use: Never used  Substance and Sexual Activity  . Alcohol use: No  . Drug use: No  . Sexual activity: Not on file  Other Topics Concern  . Not on file  Social History Narrative  . Not on file   Social Determinants of Health   Financial Resource Strain: Not on file  Food Insecurity: Not on file  Transportation Needs: Not on file  Physical Activity: Not on file  Stress: Not on file  Social Connections: Not on file  Intimate Partner Violence: Not on file          Mardella Layman, MD 06/10/20 1904

## 2020-06-10 NOTE — ED Triage Notes (Signed)
Pt sts possible exposure to trichomonas; pt denies sx

## 2020-06-14 ENCOUNTER — Telehealth (HOSPITAL_COMMUNITY): Payer: Self-pay | Admitting: Emergency Medicine

## 2020-06-14 LAB — CERVICOVAGINAL ANCILLARY ONLY
Bacterial Vaginitis (gardnerella): POSITIVE — AB
Candida Glabrata: NEGATIVE
Candida Vaginitis: POSITIVE — AB
Chlamydia: NEGATIVE
Comment: NEGATIVE
Comment: NEGATIVE
Comment: NEGATIVE
Comment: NEGATIVE
Comment: NEGATIVE
Comment: NORMAL
Neisseria Gonorrhea: NEGATIVE
Trichomonas: NEGATIVE

## 2020-06-14 MED ORDER — FLUCONAZOLE 150 MG PO TABS: 150.0000 mg | ORAL_TABLET | Freq: Once | ORAL | 0 refills | Status: AC

## 2023-08-14 ENCOUNTER — Emergency Department (HOSPITAL_COMMUNITY)

## 2023-08-14 ENCOUNTER — Other Ambulatory Visit: Payer: Self-pay

## 2023-08-14 ENCOUNTER — Emergency Department (HOSPITAL_COMMUNITY)
Admission: EM | Admit: 2023-08-14 | Discharge: 2023-08-15 | Disposition: A | Discharge status: Home / Self Care | Attending: Emergency Medicine | Admitting: Emergency Medicine

## 2023-08-14 ENCOUNTER — Encounter (HOSPITAL_COMMUNITY): Payer: Self-pay

## 2023-08-14 DIAGNOSIS — N760 Acute vaginitis: Secondary | ICD-10-CM | POA: Diagnosis not present

## 2023-08-14 DIAGNOSIS — I10 Essential (primary) hypertension: Secondary | ICD-10-CM | POA: Diagnosis not present

## 2023-08-14 DIAGNOSIS — N390 Urinary tract infection, site not specified: Secondary | ICD-10-CM | POA: Insufficient documentation

## 2023-08-14 DIAGNOSIS — R519 Headache, unspecified: Secondary | ICD-10-CM | POA: Insufficient documentation

## 2023-08-14 DIAGNOSIS — Z79899 Other long term (current) drug therapy: Secondary | ICD-10-CM | POA: Diagnosis not present

## 2023-08-14 DIAGNOSIS — B9689 Other specified bacterial agents as the cause of diseases classified elsewhere: Secondary | ICD-10-CM | POA: Diagnosis not present

## 2023-08-14 MED ORDER — ACETAMINOPHEN 500 MG PO TABS
1000.0000 mg | ORAL_TABLET | Freq: Once | ORAL | Status: AC
  Administered 2023-08-14: 1000 mg via ORAL
  Filled 2023-08-14: qty 2

## 2023-08-14 MED ORDER — KETOROLAC TROMETHAMINE 15 MG/ML IJ SOLN
15.0000 mg | Freq: Once | INTRAMUSCULAR | Status: AC
  Administered 2023-08-14: 15 mg via INTRAMUSCULAR
  Filled 2023-08-14: qty 1

## 2023-08-14 NOTE — ED Provider Notes (Incomplete)
  [place] EMERGENCY DEPARTMENT AT [facility] Provider Note   CSN: [id number] Arrival date & time: 08/14/23  2106     Patient presents with: Headache   Erica Padilla is a 48 y.o. female with no documented medical history.  Patient presents to ED for evaluation of headache.  The patient reports that beginning last night around 9 PM she developed a headache located to the back of her head.  She states that this headache has persisted throughout the course of the day.  She has not taken any medications for this headache at home.  She denies a history of headaches.  She rates the headache currently as a 5 out of 10.  She denies any fevers, neck stiffness, neck pain, nausea, vomiting, chest pain, shortness of breath, blurred vision.  She reports a history of hypertension and reports compliance on blood pressure medications, states that she saw her PCP yesterday due to elevated blood pressure at home.  Again, denies chest pain or shortness of breath.  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    Headache      Prior to Admission medications   Medication Sig Start Date End Date Taking? Authorizing Provider  cyclobenzaprine  (FLEXERIL ) 10 MG tablet Take 1 tablet (10 mg total) 2 (two) times daily as needed by mouth for muscle spasms. 12/17/16   Jamelle Lorrayne HERO, NP  metroNIDAZOLE  (FLAGYL ) 500 MG tablet Take 4 tablets as a single dose. 06/10/20   Rolinda Rogue, MD  naproxen  (NAPROSYN ) 500 MG tablet Take 1 tablet (500 mg total) 2 (two) times daily by mouth. 12/17/16   Jamelle Lorrayne HERO, NP    Allergies: Patient has no known allergies.    Review of Systems  Neurological:  Positive for headaches.    Updated Vital Signs BP (!) 158/96   Pulse 98   Temp 98.9 F (37.2 C)   Resp 19   Ht 5' 4 (1.626 m)   Wt 102.5 kg   SpO2 98%   BMI 38.79 kg/m   Physical Exam  (all labs ordered are listed, but only abnormal results are displayed) Labs Reviewed - No data to  display  EKG: None  Radiology: No results found.  {Document cardiac monitor, telemetry assessment procedure when appropriate:32947} Procedures   Medications Ordered in the ED  ketorolac  (TORADOL ) 15 MG/ML injection 15 mg (has no administration in time range)  acetaminophen  (TYLENOL ) tablet 1,000 mg (has no administration in time range)      {Click here for ABCD2, HEART and other calculators REFRESH Note before signing:1}                              Medical Decision Making Amount and/or Complexity of Data Reviewed Radiology: ordered.  Risk OTC drugs. Prescription drug management.   ***  {Document critical care time when appropriate  Document review of labs and clinical decision tools ie CHADS2VASC2, etc  Document your independent review of radiology images and any outside records  Document your discussion with family members, caretakers and with consultants  Document social determinants of health affecting pt's care  Document your decision making why or why not admission, treatments were needed:32947:::1}   Final diagnoses:  None    ED Discharge Orders     None

## 2023-08-14 NOTE — ED Triage Notes (Signed)
 Pt reports posterior headache x24 hours. No h/x migraines. Has not tried otc meds. No other sxs.

## 2023-08-14 NOTE — ED Provider Notes (Signed)
 [place] EMERGENCY DEPARTMENT AT [facility] Provider Note   CSN: [id number] Arrival date & time: 08/14/23  2106     Patient presents with: Headache   Erica Padilla is a 48 y.o. female with no documented medical history.  Patient presents to ED for evaluation of headache.  The patient reports that beginning last night around 9 PM she developed a headache located to the back of her head.  She states that this headache has persisted throughout the course of the day.  She has not taken any medications for this headache at home.  She denies a history of headaches.  She rates the headache currently as a 5 out of 10.  She denies any fevers, neck stiffness, neck pain, nausea, vomiting, chest pain, shortness of breath, blurred vision.  She reports a history of hypertension and reports compliance on blood pressure medications, states that she saw her PCP yesterday due to elevated blood pressure at home.  Again, denies chest pain or shortness of breath.  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    Headache      Prior to Admission medications   Medication Sig Start Date End Date Taking? Authorizing Provider  ibuprofen  (ADVIL ) 800 MG tablet Take 800 mg by mouth every 8 (eight) hours. 03/28/23  Yes [provider]  methocarbamol  (ROBAXIN ) 750 MG tablet Take 750 mg by mouth at bedtime. 04/11/23  Yes [provider]  tiZANidine (ZANAFLEX) 4 MG capsule Take 4 mg by mouth at bedtime as needed. 03/28/23  Yes [provider]  cyclobenzaprine  (FLEXERIL ) 10 MG tablet Take 1 tablet (10 mg total) 2 (two) times daily as needed by mouth for muscle spasms. 12/17/16   Jamelle Lorrayne HERO, NP  metroNIDAZOLE  (FLAGYL ) 500 MG tablet Take 4 tablets as a single dose. 06/10/20   Rolinda Rogue, MD  naproxen  (NAPROSYN ) 500 MG tablet Take 1 tablet (500 mg total) 2 (two) times daily by mouth. 12/17/16   Jamelle Lorrayne HERO, NP    Allergies: Patient has no known allergies.    Review of Systems   Neurological:  Positive for headaches.  All other systems reviewed and are negative.   Updated Vital Signs BP (!) 138/105   Pulse 76   Temp 98.2 F (36.8 C)   Resp 16   Ht 5' 4 (1.626 m)   Wt 102.5 kg   SpO2 100%   BMI 38.79 kg/m   Physical Exam Vitals and nursing note reviewed.  Constitutional:      General: She is not in acute distress.    Appearance: She is well-developed.  HENT:     Head: Normocephalic and atraumatic.  Eyes:     Conjunctiva/sclera: Conjunctivae normal.  Cardiovascular:     Rate and Rhythm: Normal rate and regular rhythm.     Heart sounds: No murmur heard. Pulmonary:     Effort: Pulmonary effort is normal. No respiratory distress.     Breath sounds: Normal breath sounds.  Abdominal:     Palpations: Abdomen is soft.     Tenderness: There is no abdominal tenderness.  Musculoskeletal:        General: No swelling.     Cervical back: Neck supple.  Skin:    General: Skin is warm and dry.     Capillary Refill: Capillary refill takes less than 2 seconds.  Neurological:     Mental Status: She is alert and oriented to person, place, and time. Mental status is at baseline.  Comments: CN III through XII intact.  Intact finger-nose, heel-to-shin.  No pronator drift, no slurred speech, no facial droop.  Equal strength throughout.  Equal sensation throughout.  PERRL.  Tracks across midline.  Psychiatric:        Mood and Affect: Mood normal.     (all labs ordered are listed, but only abnormal results are displayed) Labs Reviewed  CBC - Abnormal; Notable for the following components:      Result Value   Hemoglobin 11.5 (*)    All other components within normal limits  BASIC METABOLIC PANEL WITH GFR - Abnormal; Notable for the following components:   Glucose, Bld 112 (*)    Calcium 8.4 (*)    All other components within normal limits    EKG: None  Radiology: CT Head Wo Contrast Result Date: 08/15/2023 CLINICAL DATA:  Headache, increasing  frequency or severity EXAM: CT HEAD WITHOUT CONTRAST TECHNIQUE: Contiguous axial images were obtained from the base of the skull through the vertex without intravenous contrast. RADIATION DOSE REDUCTION: This exam was performed according to the departmental dose-optimization program which includes automated exposure control, adjustment of the mA and/or kV according to patient size and/or use of iterative reconstruction technique. COMPARISON:  None Available. FINDINGS: Brain: Small age indeterminate lacunar infarct in the left frontal lobe adjacent to the left frontal horn. No evidence of acute large vascular territory infarct, mass lesion, midline shift or hydrocephalus. Partially empty sella. Vascular: No hyperdense vessel. Skull: No acute fracture. Sinuses/Orbits: Mostly clear sinuses.  No acute orbital findings. Other: No mastoid effusions IMPRESSION: 1. 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 If there is concern for acute infarct, recommend MRI head. 2. Otherwise, no evidence of acute intracranial abnormality. Electronically Signed   By: Gilmore GORMAN Molt M.D.   On: 08/15/2023 00:18    Procedures   Medications Ordered in the ED  ketorolac  (TORADOL ) 15 MG/ML injection 15 mg (15 mg Intramuscular Given 08/14/23 2321)  acetaminophen  (TYLENOL ) tablet 1,000 mg (1,000 mg Oral Given 08/14/23 2320)    Clinical Course as of 08/15/23 0636  Wed Aug 15, 2023  0120 Spoke with Dr. Vanessa, neurology, he advises that CT scan most likely over read.  Recommends MRI in the morning.  If positive, admit.  If negative, discharge. [CG]  0631 24 hour posterior headache. 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 Dr. Vanessa wants MRI, if negative, can go home. If positive, neuro will follow, admit to hospitalist.  [CB]    Clinical Course User Index [CB] Bauer, Collin S, PA-C [CG] Ruthell Lonni FALCON, PA-C   Medical Decision Making Amount and/or  Complexity of Data Reviewed Labs: ordered. Radiology: ordered.  Risk OTC drugs. Prescription drug management.   49 year old female presents for evaluation.  Please see HPI for further details.  On examination the patient is afebrile and nontachycardic.  Her lung sounds are clear bilaterally, she is not hypoxic.  Abdomen soft and compressible.  Neurological examination at baseline without focal neurodeficits.  Patient presents complaining of headache, denies history of headaches.  She was given Tylenol , Toradol .  Also obtained a CT head without contrast.  CT head without contrast shows suspected age-indeterminate lacunar infarct or chronic microvascular ischemic disease in the left frontal lobe.  She has no focal neurodeficits.  She reports that her headache has resolved with medications administered.  I did speak with on-call neurology, Dr. Vanessa, who advises ordering MRI to further assess.  If MRI is positive, patient  will be admitted to hospital for stroke workup.  If negative, patient to be discharged home.  Patient will remain here in the ED until 7 AM when imaging study can be obtained.  Will collect basic labs in the event the patient does need admission.  Will sign patient out to oncoming provider [employer].  Plan of management discussed   Final diagnoses:  Nonintractable headache, unspecified chronicity pattern, unspecified headache type    ED Discharge Orders     None          Ruthell Lonni FALCON, PA-C 08/15/23 0636    Trine Raynell Moder, MD 08/20/23 [phone]

## 2023-08-15 ENCOUNTER — Emergency Department (HOSPITAL_COMMUNITY)

## 2023-08-15 LAB — WET PREP, GENITAL
Sperm: NONE SEEN
Trich, Wet Prep: NONE SEEN
WBC, Wet Prep HPF POC: <10 (ref <10)
Yeast Wet Prep HPF POC: NONE SEEN

## 2023-08-15 LAB — BASIC METABOLIC PANEL WITH GFR
Anion gap: 7 (ref 5–15)
BUN: 19 mg/dL (ref 6–20)
CO2: 25 mmol/L (ref 22–32)
Calcium: 8.4 mg/dL — ABNORMAL LOW (ref 8.9–10.3)
Chloride: 105 mmol/L (ref 98–111)
Creatinine, Ser: 0.96 mg/dL (ref 0.44–1.00)
GFR, Estimated: >60 mL/min (ref >60)
Glucose, Bld: 112 mg/dL — ABNORMAL HIGH (ref 70–99)
Potassium: 3.9 mmol/L (ref 3.5–5.1)
Sodium: 137 mmol/L (ref 135–145)

## 2023-08-15 LAB — URINALYSIS, ROUTINE W REFLEX MICROSCOPIC
Bilirubin Urine: NEGATIVE
Glucose, UA: NEGATIVE mg/dL
Ketones, ur: NEGATIVE mg/dL
Nitrite: POSITIVE — AB
Protein, ur: 30 mg/dL — AB
Specific Gravity, Urine: 1.03 (ref 1.005–1.030)
pH: 5 (ref 5.0–8.0)

## 2023-08-15 LAB — CBC
HCT: 37.9 % (ref 36.0–46.0)
Hemoglobin: 11.5 g/dL — ABNORMAL LOW (ref 12.0–15.0)
MCH: 26.9 pg (ref 26.0–34.0)
MCHC: 30.3 g/dL (ref 30.0–36.0)
MCV: 88.8 fL (ref 80.0–100.0)
Platelets: 315 K/uL (ref 150–400)
RBC: 4.27 MIL/uL (ref 3.87–5.11)
RDW: 14.5 % (ref 11.5–15.5)
WBC: 8.8 K/uL (ref 4.0–10.5)
nRBC: 0 % (ref 0.0–0.2)

## 2023-08-15 LAB — GC/CHLAMYDIA PROBE AMP (~~LOC~~) NOT AT ARMC
Chlamydia: NEGATIVE
Comment: NEGATIVE
Comment: NORMAL
Neisseria Gonorrhea: NEGATIVE

## 2023-08-15 MED ORDER — CEPHALEXIN 500 MG PO CAPS: 500.0000 mg | ORAL_CAPSULE | Freq: Two times a day (BID) | ORAL | 0 refills | Status: AC

## 2023-08-15 MED ORDER — GADOBUTROL 1 MMOL/ML IV SOLN
10.0000 mL | Freq: Once | INTRAVENOUS | Status: AC | PRN
  Administered 2023-08-15: 10 mL via INTRAVENOUS

## 2023-08-15 MED ORDER — METRONIDAZOLE 500 MG PO TABS: 500.0000 mg | ORAL_TABLET | Freq: Two times a day (BID) | ORAL | 0 refills | Status: AC

## 2023-08-15 NOTE — ED Provider Notes (Signed)
 Physical Exam  BP (!) 138/105   Pulse 76   Temp 97.8 F (36.6 C) (Oral)   Resp 16   Ht 5' 4 (1.626 m)   Wt 102.5 kg   SpO2 100%   BMI 38.79 kg/m   Physical Exam Vitals and nursing note reviewed.  Constitutional:      General: She is not in acute distress.    Appearance: Normal appearance. She is not ill-appearing.  HENT:     Head: Normocephalic and atraumatic.  Eyes:     Extraocular Movements: Extraocular movements intact.     Conjunctiva/sclera: Conjunctivae normal.  Cardiovascular:     Rate and Rhythm: Normal rate and regular rhythm.     Pulses: Normal pulses.  Pulmonary:     Effort: Pulmonary effort is normal. No respiratory distress.  Abdominal:     General: Abdomen is flat.     Palpations: Abdomen is soft.  Skin:    General: Skin is warm and dry.  Neurological:     General: No focal deficit present.     Mental Status: She is alert and oriented to person, place, and time. Mental status is at baseline.     GCS: GCS eye subscore is 4. GCS verbal subscore is 5. GCS motor subscore is 6.  Psychiatric:        Mood and Affect: Mood normal.     Procedures  Procedures  ED Course / MDM   Clinical Course as of 08/15/23 0943  Wed Aug 15, 2023  0120 Spoke with Dr. Vanessa, neurology, he advises that CT scan most likely over read.  Recommends MRI in the morning.  If positive, admit.  If negative, discharge. [CG]  0631 24 hour posterior headache. 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 Dr. Vanessa wants MRI, if negative, can go home. If positive, neuro will follow, admit to hospitalist.  [CB]    Clinical Course User Index [CB] Titania Gault S, PA-C [CG] Ruthell Lonni FALCON, PA-C   Medical Decision Making Amount and/or Complexity of Data Reviewed Labs: ordered. Radiology: ordered.  Risk OTC drugs. Prescription drug management.   Patient care transferred over from [facility].   At time of handoff, awaiting MRI with neurology already consulted who  said that if MRI is negative for stroke patient can follow-up with PCP.  Initially presenting with headache, CT read showing possible lacunar infarct and requiring MRI follow-up.  MRI has come back with microvascular changes with no acute stroke.  Will have patient continue to follow-up with PCP and to return to the ED for any new or worsening symptoms.  On reevaluation, patient states that she had complaints for UTI and was worried that she may need evaluation saying that she had UA done recently but was negative for nitrites but still having some dysuria.  Also noting some vaginal irritation.  But denies vaginal discharge, vaginal bleeding, lower abdominal pain, fever.  Wet prep and UA were done as well as GC.  UA positive for UTI.  Wet prep notable for BV.  GC pending, will treat if positive.  Sent in Keflex  and metronidazole  for UTI and BV.  Will have her follow-up with PCP for findings from MRI and for resolution of symptoms of UTI/BV.  Patient vital signs have remained stable throughout the course of patient's time in the ED. Low suspicion for any other emergent pathology at this time. I believe this patient is safe to be discharged. Provided strict return to ER precautions. Patient expressed agreement and understanding  of plan. All questions were answered.      Beola Terrall RAMAN, [place] 08/15/23 1141    Garrick Charleston, MD 08/15/23 1407

## 2023-08-15 NOTE — Discharge Instructions (Signed)
 You were seen today for headache, UTI, bacterial vaginosis.  Your MRI today showed some small vascular changes but no sign of stroke.  Recommend continue to follow-up with your PCP for these findings.  Notably you also had findings for bacterial vaginosis as well as UTI.  I am sending in some metronidazole  as well as Keflex  for you to take for these infections.  Please take these until completion, avoiding alcohol and taking this with food to limit stomach upset.  Continue to take Tylenol  and ibuprofen  for headaches.  And follow-up with PCP for further management.  Please return to the ED though if you do any new or worsening symptoms.
